# Patient Record
Sex: Female | Born: 2000 | Race: White | Hispanic: No | Marital: Single
Health system: Southern US, Community
[De-identification: ages and names within clinical notes are randomized; demographics above are authoritative.]

---
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
## 2023-10-18 ENCOUNTER — Encounter (INDEPENDENT_AMBULATORY_CARE_PROVIDER_SITE_OTHER): Payer: Self-pay | Admitting: Physician Assistant

## 2023-10-18 ENCOUNTER — Ambulatory Visit (INDEPENDENT_AMBULATORY_CARE_PROVIDER_SITE_OTHER): Admitting: Physician Assistant

## 2023-10-18 VITALS — BP 112/74 | HR 73 | Ht 63.5 in | Wt 114.0 lb

## 2023-10-18 DIAGNOSIS — S022XXA Fracture of nasal bones, initial encounter for closed fracture: Secondary | ICD-10-CM | POA: Diagnosis not present

## 2023-10-18 NOTE — Progress Notes (Signed)
 Dear Dr. Katrinka Blazing, Here is my assessment for our mutual patient, Carly Bridges. Thank you for allowing me the opportunity to care for your patient. Please do not hesitate to contact me should you have any other questions. Sincerely, Burna Forts PA-C  Otolaryngology Clinic Note Referring provider: Dr. Katrinka Blazing HPI:  Carly Bridges is a 23 y.o. female kindly referred by Dr. Katrinka Blazing   The patient patient is a 23 year old female seen today for nasal bone fracture.  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  She struck her nose on another dancer approximately 4 days ago.  She notes she heard a crack and had immediate pain of the nose.  She did have some epistaxis which was controlled.  She notes some associated swelling.  She denies any other surrounding tenderness or pain.  She went to an urgent care in [place] where she had a x-ray of her nose which was read as mid nasal bone fracture with no displacement.  She denies any nasal obstruction, she notes that her nose appears crooked to her, she notes some residual swelling and bruising.  She notes at baseline she has a deviated septum.  She denies any other complaints today.   Independent Review of Additional Tests or Records:  Urgent care visit note from 10/14/2023 [facility] [place]   PMH/Meds/All/SocHx/FamHx/ROS:  History reviewed. No pertinent past medical history.   History reviewed. No pertinent surgical history.  History reviewed. No pertinent family history.   Social Connections: Not on file      Current Outpatient Medications:    HYDROcodone-acetaminophen (NORCO/VICODIN) 5-325 MG tablet, Take 1 tablet by mouth every 4 (four) hours as needed for severe pain. (Patient not taking: Reported on 10/18/2023), Disp: 10 tablet, Rfl: 0   ibuprofen (ADVIL) 600 MG tablet, Take 1 tablet (600 mg total) by mouth every 8 (eight) hours as needed for moderate pain. (Patient not taking: Reported on 10/18/2023), Disp:  15 tablet, Rfl: 0   Physical Exam:   BP 112/74 (Cuff Size: Normal)   Pulse 73   Ht 5' 3.5" (1.613 m)   Wt 114 lb (51.7 kg)   SpO2 98%   BMI 19.88 kg/m   Pertinent Findings  CN II-XII intact Anterior rhinoscopy: Septum left deviation, no septal hematoma; bilateral inferior turbinates with no hypertrophy, minimal bruising over nasal bones, no midface tenderness or instability, nares patient bilateral  No lesions of oral cavity/oropharynx; dentition wnl No obviously palpable neck masses/lymphadenopathy/thyromegaly No respiratory distress or stridor  Seprately Identifiable Procedures:  None  Impression & Plans:  [name] is a 23 y.o. female with the following   Nasal bone fracture-  This is a 23 year old female seen in our office for evaluation of nasal bone fracture.  I am unable to review the actual imaging was described as nondisplaced.  She has no significant asymmetry of her nose other than some minimal swelling.  She has no septal hematoma, she feels that the nose is straight and has no obstruction.  I did offer CT maxillofacial although this would be unlikely to provide any significant information that would change the plan.  Given no obstruction or cosmetic asymmetry we do not plan for any operative intervention.  As the swelling goes down if she does recognize any poor cosmesis she will reach out to our office for further recommendation.  The patient and her mother verbalized understanding and agreement to today's plan had no further questions or concerns  at today's visit.   - f/u PRN   Thank you for allowing me the opportunity to care for your patient. Please do not hesitate to contact me should you have any other questions.  Sincerely, Burna Forts PA-C [facility] Phone: [phone] Fax: [phone]  10/18/2023, 1:55 PM

## 2023-10-19 ENCOUNTER — Telehealth (INDEPENDENT_AMBULATORY_CARE_PROVIDER_SITE_OTHER): Payer: Self-pay

## 2023-10-19 NOTE — Telephone Encounter (Signed)
 I attempted to call the patient. Carly Bridges

## 2023-10-19 NOTE — Telephone Encounter (Signed)
 Clifford thanks

## 2023-10-19 NOTE — Telephone Encounter (Signed)
 Which patient did she have questions about? We will need to find that out and make sure the patient gives [place] consent to speak with them.

## 2023-10-20 ENCOUNTER — Telehealth (INDEPENDENT_AMBULATORY_CARE_PROVIDER_SITE_OTHER): Payer: Self-pay | Admitting: Physician Assistant

## 2024-03-06 NOTE — Telephone Encounter (Signed)
 Faxed letter to school per J. Palmer
